# Patient Record
Sex: Male | Born: 1990 | Race: White | Hispanic: No | Marital: Single | Smoking: Current every day smoker
Health system: Southern US, Community
[De-identification: ages and names within clinical notes are randomized; demographics above are authoritative.]

## PROBLEM LIST (undated history)

## (undated) DIAGNOSIS — F419 Anxiety disorder, unspecified: Secondary | ICD-10-CM

## (undated) DIAGNOSIS — B1920 Unspecified viral hepatitis C without hepatic coma: Secondary | ICD-10-CM

## (undated) DIAGNOSIS — F32A Depression, unspecified: Secondary | ICD-10-CM

## (undated) DIAGNOSIS — A630 Anogenital (venereal) warts: Secondary | ICD-10-CM

## (undated) DIAGNOSIS — F329 Major depressive disorder, single episode, unspecified: Secondary | ICD-10-CM

## (undated) HISTORY — DX: Depression, unspecified: F32.A

## (undated) HISTORY — DX: Anxiety disorder, unspecified: F41.9

## (undated) HISTORY — DX: Major depressive disorder, single episode, unspecified: F32.9

---
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
## 2009-10-04 ENCOUNTER — Emergency Department (HOSPITAL_COMMUNITY): Admission: EM | Admit: 2009-10-04 | Discharge: 2009-10-04 | Payer: Self-pay | Admitting: Emergency Medicine

## 2011-01-24 LAB — POCT I-STAT, CHEM 8
BUN: 7 mg/dL (ref 6–23)
Calcium, Ion: 1.05 mmol/L — ABNORMAL LOW (ref 1.12–1.32)
Chloride: 110 mEq/L (ref 96–112)
Potassium: 4.1 mEq/L (ref 3.5–5.1)
Sodium: 138 mEq/L (ref 135–145)

## 2011-01-24 LAB — CBC
HCT: 42.6 % (ref 39.0–52.0)
Hemoglobin: 14.6 g/dL (ref 13.0–17.0)
MCHC: 34.3 g/dL (ref 30.0–36.0)
MCV: 90.8 fL (ref 78.0–100.0)
Platelets: 196 10*3/uL (ref 150–400)
RBC: 4.69 MIL/uL (ref 4.22–5.81)
RDW: 12.6 % (ref 11.5–15.5)
WBC: 5.8 10*3/uL (ref 4.0–10.5)

## 2011-01-24 LAB — RAPID URINE DRUG SCREEN, HOSP PERFORMED
Amphetamines: NOT DETECTED
Barbiturates: NOT DETECTED
Benzodiazepines: POSITIVE — AB
Cocaine: NOT DETECTED
Opiates: NOT DETECTED
Tetrahydrocannabinol: POSITIVE — AB

## 2011-01-24 LAB — DIFFERENTIAL
Basophils Absolute: 0 10*3/uL (ref 0.0–0.1)
Lymphocytes Relative: 23 % (ref 12–46)
Monocytes Absolute: 0.4 10*3/uL (ref 0.1–1.0)
Neutro Abs: 3.9 10*3/uL (ref 1.7–7.7)

## 2011-01-24 LAB — ETHANOL: Alcohol, Ethyl (B): <5 mg/dL (ref 0–10)

## 2012-07-11 ENCOUNTER — Ambulatory Visit (INDEPENDENT_AMBULATORY_CARE_PROVIDER_SITE_OTHER): Payer: Managed Care, Other (non HMO) | Admitting: Family Medicine

## 2012-07-11 VITALS — BP 118/68 | HR 106 | Temp 98.5°F | Resp 16 | Ht 71.0 in | Wt 168.0 lb

## 2012-07-11 DIAGNOSIS — R41840 Attention and concentration deficit: Secondary | ICD-10-CM

## 2012-07-11 NOTE — Progress Notes (Signed)
   [address]   [place], [place]  [address]   [id number]  Subjective:    Patient ID: Roy Ashley, male    DOB: 1991/10/12, 21 y.o.   MRN: [id number]  HPIThis 21 y.o. male presents for evaluation of attention deficit.  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  Having a very difficult time focusing at work.  Working long hours.  Previously prescribed Concerta in 6th grade in [facility]; unsure of name; pediatrician; at age 1.  Maintained on Concerta for one year only; not sure why stopped medication.  Duration of current job four months.  Scared will lose job; does not want to get up in mornings.  Chronic issue with lack of focus.  No sadness, depression, anxiety, nervous.  Sleeping well; usually sleeps 11:00-1:00; wakes up at 7:30am.  Graduated from high school; made 2.3 GPA at graduation (C,D).  No further schooling; wants to go to school.  Likes job; hard work.  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  Has taken some of coworker's Adderall which has helped with focus.    PMH:  None   Psurg: none All: none Medications: none Social: single; lives with parents; no children; +tobacco; no drugs; 6 beers per week; +DUI age 59.  +dating Family: M:  Thyroid problems   Sister: epilepsy, thyroid problems.  Father: healthy.   Review of Systems  Constitutional: Negative for fever, chills, activity change, appetite change and fatigue.  Psychiatric/Behavioral: Positive for decreased concentration. Negative for disturbed wake/sleep cycle and dysphoric mood. The patient is not nervous/anxious.        Objective:   Physical Exam  Nursing note and vitals reviewed. Constitutional: He is oriented to person, place, and time. He appears well-developed and well-nourished. No distress.  HENT:  Head: Normocephalic and atraumatic.  Eyes: Conjunctivae normal are normal. Pupils are equal, round, and reactive to light.  Neck: Normal range of motion. Neck supple. No thyromegaly present.  Cardiovascular:  Normal rate, regular rhythm, normal heart sounds and intact distal pulses.   No murmur heard. Pulmonary/Chest: Effort normal and breath sounds normal.  Lymphadenopathy:    He has no cervical adenopathy.  Neurological: He is alert and oriented to person, place, and time. No cranial nerve deficit. He exhibits normal muscle tone. Coordination normal.  Skin: Skin is warm and dry. He is not diaphoretic.  Psychiatric: He has a normal mood and affect. His behavior is normal. Judgment and thought content normal.       Assessment & Plan:   1. Decreased attention Span  Ambulatory referral to Psychology     1.  Decrease in attention:  New.  Reporting previous diagnosis of ADD but no treatment for past twelve years; no documentation of previous evaluation and diagnosis.  Refer to psychology for full evaluation.  Rx not provided at visit; can return to office after full evaluation by psychology; clinic will need documentation of evaluation and diagnosis.  Pt expressed understanding.  Pt declined labs including TSH at visit.

## 2012-07-11 NOTE — Patient Instructions (Addendum)
We will refer you to psychology to evaluate you for attention deficit disorder.  You should be contacted in upcoming week with appointment.

## 2012-07-15 NOTE — Progress Notes (Signed)
Reviewed and agree.

## 2012-09-01 ENCOUNTER — Ambulatory Visit (INDEPENDENT_AMBULATORY_CARE_PROVIDER_SITE_OTHER): Payer: Managed Care, Other (non HMO) | Admitting: Family Medicine

## 2012-09-01 VITALS — BP 112/72 | HR 109 | Temp 98.1°F | Resp 18 | Ht 71.5 in | Wt 158.8 lb

## 2012-09-01 DIAGNOSIS — J029 Acute pharyngitis, unspecified: Secondary | ICD-10-CM

## 2012-09-01 DIAGNOSIS — R509 Fever, unspecified: Secondary | ICD-10-CM

## 2012-09-01 LAB — POCT RAPID STREP A (OFFICE): Rapid Strep A Screen: NEGATIVE

## 2012-09-01 MED ORDER — HYDROCODONE-HOMATROPINE 5-1.5 MG/5ML PO SYRP: 5.0000 mL | ORAL_SOLUTION | Freq: Three times a day (TID) | ORAL | Status: DC | PRN

## 2012-09-01 MED ORDER — AZITHROMYCIN 500 MG PO TABS: ORAL_TABLET | ORAL | Status: DC

## 2012-09-01 NOTE — Progress Notes (Signed)
  [facility]:  Office Visit  Chief Complaint:  Chief Complaint  Patient presents with  . Sore Throat    x6days  . Otalgia  . Generalized Body Aches  . Cough    HPI: Roy Ashley is a 21 y.o. male who complains of  6 day h/o throat pain, ear pain, generalized aches, cough, took Ibuprofen to help with fever, chills, poor PO due to odynophagia. + tobacco use 1 ppd x 8 years.  Past Medical History  Diagnosis Date  . Depression   . Anxiety    History reviewed. No pertinent past surgical history. History   Social History  . Marital Status: Single    Spouse Name: N/A    Number of Children: N/A  . Years of Education: N/A   Social History Main Topics  . Smoking status: Current Some Day Smoker -- 1.0 packs/day  . Smokeless tobacco: None  . Alcohol Use: No  . Drug Use: Yes    Special: Marijuana  . Sexually Active: None   Other Topics Concern  . None   Social History Narrative  . None   Family History  Problem Relation Age of Onset  . Thyroid disease Mother   . Epilepsy Sister    No Known Allergies Prior to Admission medications   Not on File     ROS: The patient denies night sweats, unintentional weight loss, chest pain, palpitations, wheezing, dyspnea on exertion, nausea, vomiting, abdominal pain, dysuria, hematuria, melena, numbness, weakness, or tingling.   All other systems have been reviewed and were otherwise negative with the exception of those mentioned in the HPI and as above.    PHYSICAL EXAM: Filed Vitals:   09/01/12 0856  BP: 112/72  Pulse: 109  Temp: 98.1 F (36.7 C)  Resp: 18   Filed Vitals:   09/01/12 0856  Height: 5' 11.5" (1.816 m)  Weight: 158 lb 12.8 oz (72.031 kg)   Body mass index is 21.84 kg/(m^2).  General: Alert, no acute distress HEENT:  Normocephalic, atraumatic, oropharynx patent. + red tonsils, + 3 on right side, + exudates. TM nl. No sinus tenderness Cardiovascular:  Regular rate and rhythm, no rubs  murmurs or gallops.  No Carotid bruits, radial pulse intact. No pedal edema.  Respiratory: Clear to auscultation bilaterally.  No wheezes, rales, or rhonchi.  No cyanosis, no use of accessory musculature GI: No organomegaly, abdomen is soft and non-tender, positive bowel sounds.  No masses. Skin: No rashes. Neurologic: Facial musculature symmetric. Psychiatric: Patient is appropriate throughout our interaction. Lymphatic: + bilateral anterior cervical lymphadenopathy, + enlarge adenoids Musculoskeletal: Gait intact.   LABS: Results for orders placed in visit on 09/01/12  POCT RAPID STREP A (OFFICE)      Component Value Range   Rapid Strep A Screen Negative  Negative     EKG/XRAY:   Primary read interpreted by Dr. Conley Rolls at [facility].   ASSESSMENT/PLAN: Encounter Diagnoses  Name Primary?  . Pharyngitis Yes  . Fever    Most likely strep so will treat even though rapid strep negative, culture pending I do not think this is mono but with prior h/o mono will not give pcn derivative abx Will rx Azithromycin 500 mg daily x 7 days Will rx Hydromet syrup 120 ml no refills    LE, THAO PHUONG, DO 09/01/2012 9:40 AM

## 2012-09-03 ENCOUNTER — Encounter: Payer: Self-pay | Admitting: Family Medicine

## 2012-09-03 LAB — CULTURE, GROUP A STREP: Organism ID, Bacteria: NORMAL

## 2015-08-17 ENCOUNTER — Ambulatory Visit (INDEPENDENT_AMBULATORY_CARE_PROVIDER_SITE_OTHER): Payer: Self-pay

## 2015-08-17 ENCOUNTER — Ambulatory Visit (INDEPENDENT_AMBULATORY_CARE_PROVIDER_SITE_OTHER): Payer: Self-pay | Admitting: Internal Medicine

## 2015-08-17 VITALS — BP 146/62 | HR 79 | Temp 98.3°F | Resp 16 | Ht 71.5 in | Wt 148.0 lb

## 2015-08-17 DIAGNOSIS — S93401A Sprain of unspecified ligament of right ankle, initial encounter: Secondary | ICD-10-CM

## 2015-08-17 DIAGNOSIS — M25571 Pain in right ankle and joints of right foot: Secondary | ICD-10-CM

## 2015-08-17 MED ORDER — IBUPROFEN 600 MG PO TABS: 600.0000 mg | ORAL_TABLET | Freq: Three times a day (TID) | ORAL | Status: DC | PRN

## 2015-08-17 NOTE — Progress Notes (Signed)
Patient ID: Roy Ashley Finklea, male   DOB: Mar 16, 1991, 24 y.o.   MRN: [id number]   08/17/2015 at 10:43 AM  Roy Ashley Bilal / DOB: Mar 16, 1991 / MRN: [id number]  Problem list reviewed and updated by me where necessary.   SUBJECTIVE  Roy Ashley Peedin is Ashley 24 y.o. ill appearing male presenting for the chief complaint of injured right ankle, pain ankle, unable to walk.Marland Kitchen.He fell down stairs last night, no hx for abuse or assault, no other injuries.   Usually healthy.  He  has Ashley past medical history of Depression and Anxiety.    Medications reviewed and updated by myself where necessary, and exist elsewhere in the encounter.   Mr. Marina Goodellerry has No Known Allergies. He  reports that he has been smoking.  He does not have any smokeless tobacco history on file. He reports that he uses illicit drugs (Marijuana). He reports that he does not drink alcohol. He  has no sexual activity history on file. The patient  has no past surgical history on file.  His family history includes Epilepsy in his sister; Thyroid disease in his mother.  Review of Systems  Constitutional: Negative.  Negative for fever.  HENT: Negative.   Eyes: Negative.   Respiratory: Negative.  Negative for shortness of breath.   Cardiovascular: Negative.  Negative for chest pain.  Gastrointestinal: Negative.  Negative for nausea.  Musculoskeletal: Positive for myalgias, joint pain and falls.  Skin: Negative for rash.  Neurological: Negative.  Negative for dizziness and headaches.  Psychiatric/Behavioral: Negative.     OBJECTIVE  His  height is 5' 11.5" (1.816 m) and weight is 148 lb (67.132 kg). His oral temperature is 98.3 F (36.8 C). His blood pressure is 146/62 and his pulse is 79. His respiration is 16 and oxygen saturation is 98%.  The patient's body mass index is 20.36 kg/(m^2).  Physical Exam  Constitutional: He is oriented to person, place, and time. He appears well-developed and well-nourished. No distress.  HENT:  Head:  Normocephalic.  Nose: Nose normal.  Eyes: Conjunctivae and EOM are normal.  Respiratory: Effort normal.  GI: There is no tenderness.  Musculoskeletal:       Right ankle: He exhibits decreased range of motion, swelling, ecchymosis and laceration. He exhibits normal pulse. Tenderness. Lateral malleolus and proximal fibula tenderness found. No medial malleolus and no head of 5th metatarsal tenderness found. Achilles tendon normal. Achilles tendon exhibits normal Thompson's test results.       Feet:  Abrasion over lat malleolus  Neurological: He is alert and oriented to person, place, and time. He has normal strength. No sensory deficit. He exhibits normal muscle tone. Coordination and gait abnormal.  Psychiatric: He has Ashley normal mood and affect. His behavior is normal.  [facility] reading (PRIMARY) by  Dr.Sang Blount no fx seen, sts only    No results found for this or any previous visit (from the past 24 hour(s)).  ASSESSMENT & PLAN  Casimiro NeedleMichael was seen today for ankle pain.  Diagnoses and all orders for this visit:  Sprain of ankle, right, initial encounter -     DG Ankle Complete Right; Future  Pain in joint, ankle and foot, right -     DG Ankle Complete Right; Future   Camwalker for 1-2 weeks/then swedo fitted if ready RICE/crutches

## 2015-08-17 NOTE — Patient Instructions (Addendum)
Acute Ankle Sprain With Phase I Rehab An acute ankle sprain is a partial or complete tear in one or more of the ligaments of the ankle due to traumatic injury. The severity of the injury depends on both the number of ligaments sprained and the grade of sprain. There are 3 grades of sprains.   A grade 1 sprain is a mild sprain. There is a slight pull without obvious tearing. There is no loss of strength, and the muscle and ligament are the correct length.  A grade 2 sprain is a moderate sprain. There is tearing of fibers within the substance of the ligament where it connects two bones or two cartilages. The length of the ligament is increased, and there is usually decreased strength.  A grade 3 sprain is a complete rupture of the ligament and is uncommon. In addition to the grade of sprain, there are three types of ankle sprains.  Lateral ankle sprains: This is a sprain of one or more of the three ligaments on the outer side (lateral) of the ankle. These are the most common sprains. Medial ankle sprains: There is one large triangular ligament of the inner side (medial) of the ankle that is susceptible to injury. Medial ankle sprains are less common. Syndesmosis, "high ankle," sprains: The syndesmosis is the ligament that connects the two bones of the lower leg. Syndesmosis sprains usually only occur with very severe ankle sprains. SYMPTOMS  Pain, tenderness, and swelling in the ankle, starting at the side of injury that may progress to the whole ankle and foot with time.  "Pop" or tearing sensation at the time of injury.  Bruising that may spread to the heel.  Impaired ability to walk soon after injury. CAUSES   Acute ankle sprains are caused by trauma placed on the ankle that temporarily forces or pries the anklebone (talus) out of its normal socket.  Stretching or tearing of the ligaments that normally hold the joint in place (usually due to a twisting injury). RISK INCREASES  WITH:  Previous ankle sprain.  Sports in which the foot may land awkwardly (i.e., basketball, volleyball, or soccer) or walking or running on uneven or rough surfaces.  Shoes with inadequate support to prevent sideways motion when stress occurs.  Poor strength and flexibility.  Poor balance skills.  Contact sports. PREVENTION   Warm up and stretch properly before activity.  Maintain physical fitness:  Ankle and leg flexibility, muscle strength, and endurance.  Cardiovascular fitness.  Balance training activities.  Use proper technique and have a coach correct improper technique.  Taping, protective strapping, bracing, or high-top tennis shoes may help prevent injury. Initially, tape is best; however, it loses most of its support function within 10 to 15 minutes.  Wear proper-fitted protective shoes (High-top shoes with taping or bracing is more effective than either alone).  Provide the ankle with support during sports and practice activities for 12 months following injury. PROGNOSIS   If treated properly, ankle sprains can be expected to recover completely; however, the length of recovery depends on the degree of injury.  A grade 1 sprain usually heals enough in 5 to 7 days to allow modified activity and requires an average of 6 weeks to heal completely.  A grade 2 sprain requires 6 to 10 weeks to heal completely.  A grade 3 sprain requires 12 to 16 weeks to heal.  A syndesmosis sprain often takes more than 3 months to heal. RELATED COMPLICATIONS   Frequent recurrence of symptoms may  result in a chronic problem. Appropriately addressing the problem the first time decreases the frequency of recurrence and optimizes healing time. Severity of the initial sprain does not predict the likelihood of later instability.  Injury to other structures (bone, cartilage, or tendon).  A chronically unstable or arthritic ankle joint is a possibility with repeated  sprains. TREATMENT Treatment initially involves the use of ice, medication, and compression bandages to help reduce pain and inflammation. Ankle sprains are usually immobilized in a walking cast or boot to allow for healing. Crutches may be recommended to reduce pressure on the injury. After immobilization, strengthening and stretching exercises may be necessary to regain strength and a full range of motion. Surgery is rarely needed to treat ankle sprains. MEDICATION   Nonsteroidal anti-inflammatory medications, such as aspirin and ibuprofen (do not take for the first 3 days after injury or within 7 days before surgery), or other minor pain relievers, such as acetaminophen, are often recommended. Take these as directed by your caregiver. Contact your caregiver immediately if any bleeding, stomach upset, or signs of an allergic reaction occur from these medications.  Ointments applied to the skin may be helpful.  Pain relievers may be prescribed as necessary by your caregiver. Do not take prescription pain medication for longer than 4 to 7 days. Use only as directed and only as much as you need. HEAT AND COLD  Cold treatment (icing) is used to relieve pain and reduce inflammation for acute and chronic cases. Cold should be applied for 10 to 15 minutes every 2 to 3 hours for inflammation and pain and immediately after any activity that aggravates your symptoms. Use ice packs or an ice massage.  Heat treatment may be used before performing stretching and strengthening activities prescribed by your caregiver. Use a heat pack or a warm soak. SEEK IMMEDIATE MEDICAL CARE IF:   Pain, swelling, or bruising worsens despite treatment.  You experience pain, numbness, discoloration, or coldness in the foot or toes.  New, unexplained symptoms develop (drugs used in treatment may produce side effects.) EXERCISES  PHASE I EXERCISES RANGE OF MOTION (ROM) AND STRETCHING EXERCISES - Ankle Sprain, Acute Phase I,  Weeks 1 to 2 These exercises may help you when beginning to restore flexibility in your ankle. You will likely work on these exercises for the 1 to 2 weeks after your injury. Once your physician, physical therapist, or athletic trainer sees adequate progress, he or she will advance your exercises. While completing these exercises, remember:   Restoring tissue flexibility helps normal motion to return to the joints. This allows healthier, less painful movement and activity.  An effective stretch should be held for at least 30 seconds.  A stretch should never be painful. You should only feel a gentle lengthening or release in the stretched tissue. RANGE OF MOTION - Dorsi/Plantar Flexion  While sitting with your right / left knee straight, draw the top of your foot upwards by flexing your ankle. Then reverse the motion, pointing your toes downward.  Hold each position for __________ seconds.  After completing your first set of exercises, repeat this exercise with your knee bent. Repeat __________ times. Complete this exercise __________ times per day.  RANGE OF MOTION - Ankle Alphabet  Imagine your right / left big toe is a pen.  Keeping your hip and knee still, write out the entire alphabet with your "pen." Make the letters as large as you can without increasing any discomfort. Repeat __________ times. Complete this exercise __________   times per day.  STRENGTHENING EXERCISES - Ankle Sprain, Acute -Phase I, Weeks 1 to 2 These exercises may help you when beginning to restore strength in your ankle. You will likely work on these exercises for 1 to 2 weeks after your injury. Once your physician, physical therapist, or athletic trainer sees adequate progress, he or she will advance your exercises. While completing these exercises, remember:   Muscles can gain both the endurance and the strength needed for everyday activities through controlled exercises.  Complete these exercises as instructed by  your physician, physical therapist, or athletic trainer. Progress the resistance and repetitions only as guided.  You may experience muscle soreness or fatigue, but the pain or discomfort you are trying to eliminate should never worsen during these exercises. If this pain does worsen, stop and make certain you are following the directions exactly. If the pain is still present after adjustments, discontinue the exercise until you can discuss the trouble with your clinician. STRENGTH - Dorsiflexors  Secure a rubber exercise band/tubing to a fixed object (i.e., table, pole) and loop the other end around your right / left foot.  Sit on the floor facing the fixed object. The band/tubing should be slightly tense when your foot is relaxed.  Slowly draw your foot back toward you using your ankle and toes.  Hold this position for __________ seconds. Slowly release the tension in the band and return your foot to the starting position. Repeat __________ times. Complete this exercise __________ times per day.  STRENGTH - Plantar-flexors   Sit with your right / left leg extended. Holding onto both ends of a rubber exercise band/tubing, loop it around the ball of your foot. Keep a slight tension in the band.  Slowly push your toes away from you, pointing them downward.  Hold this position for __________ seconds. Return slowly, controlling the tension in the band/tubing. Repeat __________ times. Complete this exercise __________ times per day.  STRENGTH - Ankle Eversion  Secure one end of a rubber exercise band/tubing to a fixed object (table, pole). Loop the other end around your foot just before your toes.  Place your fists between your knees. This will focus your strengthening at your ankle.  Drawing the band/tubing across your opposite foot, slowly, pull your little toe out and up. Make sure the band/tubing is positioned to resist the entire motion.  Hold this position for __________ seconds. Have  your muscles resist the band/tubing as it slowly pulls your foot back to the starting position.  Repeat __________ times. Complete this exercise __________ times per day.  STRENGTH - Ankle Inversion  Secure one end of a rubber exercise band/tubing to a fixed object (table, pole). Loop the other end around your foot just before your toes.  Place your fists between your knees. This will focus your strengthening at your ankle.  Slowly, pull your big toe up and in, making sure the band/tubing is positioned to resist the entire motion.  Hold this position for __________ seconds.  Have your muscles resist the band/tubing as it slowly pulls your foot back to the starting position. Repeat __________ times. Complete this exercises __________ times per day.  STRENGTH - Towel Curls  Sit in a chair positioned on a non-carpeted surface.  Place your right / left foot on a towel, keeping your heel on the floor.  Pull the towel toward your heel by only curling your toes. Keep your heel on the floor.  If instructed by your physician, physical therapist,   or athletic trainer, add weight to the end of the towel. Repeat __________ times. Complete this exercise __________ times per day.   This information is not intended to replace advice given to you by your health care provider. Make sure you discuss any questions you have with your health care provider.   Document Released: 05/10/2005 Document Revised: 10/30/2014 Document Reviewed: 01/21/2009 Elsevier Interactive Patient Education 2016 Elsevier Inc[employer] RICE for Routine Care of Injuries Theroutine careofmanyinjuriesincludes rest, ice, compression, and elevation (RICE therapy). RICE therapy is often recommended for injuries to soft tissues, such as a muscle strain, ligament injuries, bruises, and overuse injuries. It can also be used for some bony injuries. Using RICE therapy can help to relieve pain, lessen swelling, and enable your body to  heal. Rest Rest is required to allow your body to heal. This usually involves reducing your normal activities and avoiding use of the injured part of your body. Generally, you can return to your normal activities when you are comfortable and have been given permission by your health care provider. Ice Icing your injury helps to keep the swelling down, and it lessens pain. Do not apply ice directly to your skin.  Put ice in a plastic bag.  Place a towel between your skin and the bag.  Leave the ice on for 20 minutes, 2-3 times a day. Do this for as long as you are directed by your health care provider. Compression Compression means putting pressure on the injured area. Compression helps to keep swelling down, gives support, and helps with discomfort. Compression may be done with an elastic bandage. If an elastic bandage has been applied, follow these general tips:  Remove and reapply the bandage every 3-4 hours or as directed by your health care provider.  Make sure the bandage is not wrapped too tightly, because this can cut off circulation. If part of your body beyond the bandage becomes blue, numb, cold, swollen, or more painful, your bandage is most likely too tight. If this occurs, remove your bandage and reapply it more loosely.  See your health care provider if the bandage seems to be making your problems worse rather than better. Elevation Elevation means keeping the injured area raised. This helps to lessen swelling and decrease pain. If possible, your injured area should be elevated at or above the level of your heart or the center of your chest. WHEN SHOULD I SEEK MEDICAL CARE? You should seek medical care if:  Your pain and swelling continue.  Your symptoms are getting worse rather than improving. These symptoms may indicate that further evaluation or further X-rays are needed. Sometimes, X-rays may not show a small broken bone (fracture) until a number of days later. Make a  follow-up appointment with your health care provider. WHEN SHOULD I SEEK IMMEDIATE MEDICAL CARE? You should seek immediate medical care if:  You have sudden severe pain at or below the area of your injury.  You have redness or increased swelling around your injury.  You have tingling or numbness at or below the area of your injury that does not improve after you remove the elastic bandage.   This information is not intended to replace advice given to you by your health care provider. Make sure you discuss any questions you have with your health care provider.   Document Released: 01/21/2001 Document Revised: 06/30/2015 Document Reviewed: 09/16/2014 Elsevier Interactive Patient Education [employer]2016 [employer]

## 2016-07-30 ENCOUNTER — Emergency Department (HOSPITAL_COMMUNITY)
Admission: EM | Admit: 2016-07-30 | Discharge: 2016-07-30 | Disposition: A | Discharge status: Home / Self Care | Payer: Self-pay | Attending: Emergency Medicine | Admitting: Emergency Medicine

## 2016-07-30 ENCOUNTER — Emergency Department (HOSPITAL_COMMUNITY): Payer: Self-pay

## 2016-07-30 ENCOUNTER — Encounter (HOSPITAL_COMMUNITY): Payer: Self-pay | Admitting: Emergency Medicine

## 2016-07-30 DIAGNOSIS — Y929 Unspecified place or not applicable: Secondary | ICD-10-CM | POA: Insufficient documentation

## 2016-07-30 DIAGNOSIS — Y939 Activity, unspecified: Secondary | ICD-10-CM | POA: Insufficient documentation

## 2016-07-30 DIAGNOSIS — M25561 Pain in right knee: Secondary | ICD-10-CM | POA: Insufficient documentation

## 2016-07-30 DIAGNOSIS — M25571 Pain in right ankle and joints of right foot: Secondary | ICD-10-CM | POA: Insufficient documentation

## 2016-07-30 DIAGNOSIS — F17200 Nicotine dependence, unspecified, uncomplicated: Secondary | ICD-10-CM | POA: Insufficient documentation

## 2016-07-30 DIAGNOSIS — Y999 Unspecified external cause status: Secondary | ICD-10-CM | POA: Insufficient documentation

## 2016-07-30 DIAGNOSIS — F1290 Cannabis use, unspecified, uncomplicated: Secondary | ICD-10-CM | POA: Insufficient documentation

## 2016-07-30 MED ORDER — IBUPROFEN 800 MG PO TABS: 800.0000 mg | ORAL_TABLET | Freq: Three times a day (TID) | ORAL | 0 refills | Status: AC

## 2016-07-30 MED ORDER — OXYCODONE-ACETAMINOPHEN 5-325 MG PO TABS
1.0000 | ORAL_TABLET | ORAL | Status: DC | PRN
  Administered 2016-07-30: 1 via ORAL
  Filled 2016-07-30: qty 1
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
## 2016-07-30 NOTE — Discharge Instructions (Signed)
1. Continue home medications. 2. Start taking Ibuprofen three times daily.  You may also take 1000 mg Tylenol every 6 hours.  Do not exceed 4 grams in 24 hours.  Apply ice three times daily. 3.  Follow up with orthopedics if symptoms persist.
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
## 2018-10-18 ENCOUNTER — Encounter (HOSPITAL_BASED_OUTPATIENT_CLINIC_OR_DEPARTMENT_OTHER): Payer: Self-pay | Admitting: Emergency Medicine

## 2018-10-18 ENCOUNTER — Other Ambulatory Visit: Payer: Self-pay

## 2018-10-18 ENCOUNTER — Emergency Department (HOSPITAL_BASED_OUTPATIENT_CLINIC_OR_DEPARTMENT_OTHER)
Admission: EM | Admit: 2018-10-18 | Discharge: 2018-10-18 | Disposition: A | Discharge status: Home / Self Care | Payer: Self-pay | Attending: Emergency Medicine | Admitting: Emergency Medicine

## 2018-10-18 DIAGNOSIS — Z87891 Personal history of nicotine dependence: Secondary | ICD-10-CM | POA: Insufficient documentation

## 2018-10-18 DIAGNOSIS — A630 Anogenital (venereal) warts: Secondary | ICD-10-CM | POA: Insufficient documentation

## 2018-10-18 DIAGNOSIS — Z79899 Other long term (current) drug therapy: Secondary | ICD-10-CM | POA: Insufficient documentation

## 2018-10-18 HISTORY — DX: Unspecified viral hepatitis C without hepatic coma: B19.20

## 2018-10-18 HISTORY — DX: Anogenital (venereal) warts: A63.0

## 2018-10-18 MED ORDER — IMIQUIMOD 3.75 % EX CREA: TOPICAL_CREAM | CUTANEOUS | 0 refills | Status: DC
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
## 2018-10-18 NOTE — Discharge Instructions (Signed)
You were seen here today for concerns for genital warts.  I have provided you a prescription for Imiquimod. Use as directed.  Please follow-up with the health department for further testing and treatment.  You were offered testing of HIV, Syphilis, Gonorrhea and Chlamydia here in the department. Please see attached handout. If you develop worsening or new concerning symptoms you can return to the emergency department for re-evaluation.
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
## 2018-10-18 NOTE — ED Triage Notes (Addendum)
Pt sts he has had genital warts since age 27, and he has gotten cream for it before that "made them fall off".  Sts they are bigger now and he has seen TV ads about them causing cancer and he wants to see for sure what this is and "make sure I'm OK."  [facility]

## 2018-10-18 NOTE — ED Notes (Signed)
ED Provider at bedside. 

## 2018-10-18 NOTE — ED Notes (Signed)
Urine spec obtained and sent to lab.

## 2018-10-19 ENCOUNTER — Telehealth (HOSPITAL_BASED_OUTPATIENT_CLINIC_OR_DEPARTMENT_OTHER): Payer: Self-pay | Admitting: Emergency Medicine

## 2019-08-06 ENCOUNTER — Inpatient Hospital Stay (HOSPITAL_COMMUNITY)
Admission: EM | Admit: 2019-08-06 | Discharge: 2019-08-07 | DRG: 563 | Discharge status: Against Medical Advice | Payer: Self-pay | Attending: Orthopedic Surgery | Admitting: Orthopedic Surgery

## 2019-08-06 ENCOUNTER — Encounter (HOSPITAL_COMMUNITY): Payer: Self-pay

## 2019-08-06 ENCOUNTER — Emergency Department (HOSPITAL_COMMUNITY): Payer: Self-pay

## 2019-08-06 ENCOUNTER — Other Ambulatory Visit: Payer: Self-pay

## 2019-08-06 DIAGNOSIS — Z20828 Contact with and (suspected) exposure to other viral communicable diseases: Secondary | ICD-10-CM | POA: Diagnosis present

## 2019-08-06 DIAGNOSIS — T1490XA Injury, unspecified, initial encounter: Secondary | ICD-10-CM

## 2019-08-06 DIAGNOSIS — S42002A Fracture of unspecified part of left clavicle, initial encounter for closed fracture: Secondary | ICD-10-CM | POA: Diagnosis present

## 2019-08-06 DIAGNOSIS — S82291A Other fracture of shaft of right tibia, initial encounter for closed fracture: Principal | ICD-10-CM | POA: Diagnosis present

## 2019-08-06 LAB — I-STAT CHEM 8, ED
BUN: 12 mg/dL (ref 6–20)
Calcium, Ion: 1.22 mmol/L (ref 1.15–1.40)
Chloride: 106 mmol/L (ref 98–111)
Creatinine, Ser: 0.9 mg/dL (ref 0.61–1.24)
Glucose, Bld: 95 mg/dL (ref 70–99)
HCT: 35 % — ABNORMAL LOW (ref 39.0–52.0)
Hemoglobin: 11.9 g/dL — ABNORMAL LOW (ref 13.0–17.0)
Potassium: 4.5 mmol/L (ref 3.5–5.1)
Sodium: 139 mmol/L (ref 135–145)
TCO2: 25 mmol/L (ref 22–32)

## 2019-08-06 LAB — COMPREHENSIVE METABOLIC PANEL
ALT: 58 U/L — ABNORMAL HIGH (ref 0–44)
AST: 38 U/L (ref 15–41)
Albumin: 3.7 g/dL (ref 3.5–5.0)
Alkaline Phosphatase: 72 U/L (ref 38–126)
Anion gap: 11 (ref 5–15)
BUN: 13 mg/dL (ref 6–20)
CO2: 22 mmol/L (ref 22–32)
Calcium: 9.2 mg/dL (ref 8.9–10.3)
Chloride: 104 mmol/L (ref 98–111)
Creatinine, Ser: 0.98 mg/dL (ref 0.61–1.24)
GFR calc Af Amer: >60 mL/min (ref >60)
GFR calc non Af Amer: >60 mL/min (ref >60)
Glucose, Bld: 97 mg/dL (ref 70–99)
Potassium: 4.5 mmol/L (ref 3.5–5.1)
Sodium: 137 mmol/L (ref 135–145)
Total Bilirubin: 0.5 mg/dL (ref 0.3–1.2)
Total Protein: 7.1 g/dL (ref 6.5–8.1)

## 2019-08-06 LAB — CBC
HCT: 36.5 % — ABNORMAL LOW (ref 39.0–52.0)
Hemoglobin: 11.6 g/dL — ABNORMAL LOW (ref 13.0–17.0)
MCH: 27.6 pg (ref 26.0–34.0)
MCHC: 31.8 g/dL (ref 30.0–36.0)
MCV: 86.7 fL (ref 80.0–100.0)
Platelets: 198 10*3/uL (ref 150–400)
RBC: 4.21 MIL/uL — ABNORMAL LOW (ref 4.22–5.81)
RDW: 15.4 % (ref 11.5–15.5)
WBC: 7.1 10*3/uL (ref 4.0–10.5)
nRBC: 0 % (ref 0.0–0.2)

## 2019-08-06 LAB — SAMPLE TO BLOOD BANK

## 2019-08-06 LAB — LACTIC ACID, PLASMA: Lactic Acid, Venous: 0.7 mmol/L (ref 0.5–1.9)

## 2019-08-06 LAB — PROTIME-INR
INR: 1 (ref 0.8–1.2)
Prothrombin Time: 12.6 seconds (ref 11.4–15.2)

## 2019-08-06 LAB — ETHANOL: Alcohol, Ethyl (B): <10 mg/dL (ref <10)

## 2019-08-06 MED ORDER — IOHEXOL 300 MG/ML  SOLN
100.0000 mL | Freq: Once | INTRAMUSCULAR | Status: AC | PRN
  Administered 2019-08-06: 22:00:00 100 mL via INTRAVENOUS

## 2019-08-06 MED ORDER — FENTANYL CITRATE (PF) 100 MCG/2ML IJ SOLN
50.0000 ug | Freq: Once | INTRAMUSCULAR | Status: AC
  Administered 2019-08-06: 22:00:00 50 ug via INTRAVENOUS
  Filled 2019-08-06: qty 2

## 2019-08-06 MED ORDER — TETANUS-DIPHTH-ACELL PERTUSSIS 5-2.5-18.5 LF-MCG/0.5 IM SUSP: 0.5000 mL | Freq: Once | INTRAMUSCULAR | Status: DC

## 2019-08-06 MED ORDER — ONDANSETRON HCL 4 MG/2ML IJ SOLN
4.0000 mg | Freq: Once | INTRAMUSCULAR | Status: AC
  Administered 2019-08-06: 4 mg via INTRAVENOUS
  Filled 2019-08-06: qty 2
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
## 2019-08-06 NOTE — Progress Notes (Signed)
I have reviewed xrays and discussed case with EDP.  Will likely need ORIF of both left clavicle and certainly right tibia.  I will discuss with Ortho Trauma team as to the timing of the surgeries.  Full consult to come in the am.  NPO tonight at [place].    Sling to left arm and splint to left leg at this time.

## 2019-08-07 LAB — URINALYSIS, ROUTINE W REFLEX MICROSCOPIC
Bilirubin Urine: NEGATIVE
Glucose, UA: NEGATIVE mg/dL
Hgb urine dipstick: NEGATIVE
Ketones, ur: 20 mg/dL — AB
Leukocytes,Ua: NEGATIVE
Nitrite: NEGATIVE
Protein, ur: NEGATIVE mg/dL
Specific Gravity, Urine: 1.044 — ABNORMAL HIGH (ref 1.005–1.030)
pH: 8 (ref 5.0–8.0)

## 2019-08-07 LAB — SARS CORONAVIRUS 2 BY RT PCR (HOSPITAL ORDER, PERFORMED IN ~~LOC~~ HOSPITAL LAB): SARS Coronavirus 2: NEGATIVE

## 2019-08-07 MED ORDER — HYDROCODONE-ACETAMINOPHEN 5-325 MG PO TABS: 1.0000 | ORAL_TABLET | ORAL | Status: DC | PRN

## 2019-08-07 MED ORDER — ACETAMINOPHEN 500 MG PO TABS: 500.0000 mg | ORAL_TABLET | Freq: Four times a day (QID) | ORAL | Status: DC

## 2019-08-07 MED ORDER — HYDROCODONE-ACETAMINOPHEN 7.5-325 MG PO TABS: 1.0000 | ORAL_TABLET | ORAL | Status: DC | PRN

## 2019-08-07 MED ORDER — MORPHINE SULFATE (PF) 2 MG/ML IV SOLN
0.5000 mg | INTRAVENOUS | Status: DC | PRN
  Filled 2019-08-07: qty 1

## 2019-08-07 MED ORDER — ONDANSETRON HCL 4 MG/2ML IJ SOLN: 4.0000 mg | Freq: Four times a day (QID) | INTRAMUSCULAR | Status: DC | PRN

## 2019-08-07 MED ORDER — METHOCARBAMOL 1000 MG/10ML IJ SOLN: 500.0000 mg | Freq: Four times a day (QID) | INTRAVENOUS | Status: DC | PRN

## 2019-08-07 MED ORDER — ACETAMINOPHEN 325 MG PO TABS: 325.0000 mg | ORAL_TABLET | Freq: Four times a day (QID) | ORAL | Status: DC | PRN

## 2019-08-07 MED ORDER — METHOCARBAMOL 500 MG PO TABS: 500.0000 mg | ORAL_TABLET | Freq: Four times a day (QID) | ORAL | Status: DC | PRN

## 2019-08-07 MED ORDER — ONDANSETRON HCL 4 MG PO TABS: 4.0000 mg | ORAL_TABLET | Freq: Four times a day (QID) | ORAL | Status: DC | PRN

## 2019-08-07 NOTE — ED Notes (Signed)
Pt became irrate, demaning pain medicine.  As RN was returning w/ medication visitor came out yelling saying that they were going to [place].  RN notified provider who went bedside and attempted to convince pt that we could control pain but he could not have anything to eat b/c of the upcoming surgery.  Pt stated "I'll go and get my own medication."  Pt demanded to leave, he is leaving AMA.

## 2019-08-07 NOTE — ED Provider Notes (Signed)
Informed by nursing that patient wishing to leave AMA.    Patient complaining that he cannot eat and is wishing to obtain pain medication.  Pain medication has been ordered and the nurse has pulled it from the [facility].  He is refusing to stay and states that he wants to go to [employer].  I discussed with him that this will prolong his hospital course and potential for surgery.  He reports that he wants to get something to eat.  Again I discussed with him that because of surgery, eating would prolong his ability to get his leg fixed.  Patient persistent about leaving and understands the risk and benefits.  I did discuss with him that his coronavirus test was negative which would mean he could have surgery later today.  He states "I do not care."  We discussed leaving [place].  Patient was able to verbalize understanding.  I did discuss the patient with Dr. Stann Mainland and updated him with the patient's wishes to leave [place].        Merryl Hacker, MD 08/07/19 [phone]
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
## 2019-08-07 NOTE — Progress Notes (Signed)
Orthopedic Tech Progress Note Patient Details:  Roy Ashley Jun 20, 1991 [id number]  Ortho Devices Type of Ortho Device: Short leg splint, Sling immobilizer Ortho Device/Splint Interventions: Adjustment, Application, Ordered   Post Interventions Patient Tolerated: Well Instructions Provided: Care of device, Adjustment of device   Cheryl Chay T 08/07/2019, 1:22 AM

## 2019-08-08 DIAGNOSIS — S42022A Displaced fracture of shaft of left clavicle, initial encounter for closed fracture: Secondary | ICD-10-CM | POA: Insufficient documentation

## 2019-08-21 ENCOUNTER — Encounter (HOSPITAL_BASED_OUTPATIENT_CLINIC_OR_DEPARTMENT_OTHER): Payer: Self-pay | Admitting: *Deleted

## 2019-08-21 ENCOUNTER — Other Ambulatory Visit: Payer: Self-pay

## 2019-08-21 ENCOUNTER — Emergency Department (HOSPITAL_BASED_OUTPATIENT_CLINIC_OR_DEPARTMENT_OTHER)
Admission: EM | Admit: 2019-08-21 | Discharge: 2019-08-21 | Disposition: A | Discharge status: Home / Self Care | Payer: Self-pay | Attending: Emergency Medicine | Admitting: Emergency Medicine

## 2019-08-21 ENCOUNTER — Emergency Department (HOSPITAL_BASED_OUTPATIENT_CLINIC_OR_DEPARTMENT_OTHER): Payer: Self-pay

## 2019-08-21 DIAGNOSIS — L02414 Cutaneous abscess of left upper limb: Secondary | ICD-10-CM | POA: Insufficient documentation

## 2019-08-21 DIAGNOSIS — F17210 Nicotine dependence, cigarettes, uncomplicated: Secondary | ICD-10-CM | POA: Insufficient documentation

## 2019-08-21 DIAGNOSIS — Z79899 Other long term (current) drug therapy: Secondary | ICD-10-CM | POA: Insufficient documentation

## 2019-08-21 DIAGNOSIS — L0291 Cutaneous abscess, unspecified: Secondary | ICD-10-CM

## 2019-08-21 MED ORDER — ACETAMINOPHEN 500 MG PO TABS
1000.0000 mg | ORAL_TABLET | Freq: Once | ORAL | Status: AC
  Administered 2019-08-21: 18:00:00 1000 mg via ORAL
  Filled 2019-08-21: qty 2

## 2019-08-21 MED ORDER — LIDOCAINE-EPINEPHRINE (PF) 2 %-1:200000 IJ SOLN
20.0000 mL | Freq: Once | INTRAMUSCULAR | Status: AC
  Administered 2019-08-21: 10 mL
  Filled 2019-08-21: qty 20

## 2019-08-21 MED ORDER — SULFAMETHOXAZOLE-TRIMETHOPRIM 800-160 MG PO TABS
1.0000 | ORAL_TABLET | Freq: Once | ORAL | Status: AC
  Administered 2019-08-21: 1 via ORAL
  Filled 2019-08-21: qty 1

## 2019-08-21 MED ORDER — SULFAMETHOXAZOLE-TRIMETHOPRIM 800-160 MG PO TABS: 1.0000 | ORAL_TABLET | Freq: Two times a day (BID) | ORAL | 0 refills | Status: AC

## 2019-08-21 MED ORDER — IBUPROFEN 400 MG PO TABS
600.0000 mg | ORAL_TABLET | Freq: Once | ORAL | Status: AC
  Administered 2019-08-21: 600 mg via ORAL
  Filled 2019-08-21: qty 1

## 2019-08-21 NOTE — ED Notes (Signed)
ED Provider at bedside. 

## 2019-08-21 NOTE — ED Notes (Signed)
Returned from XR 

## 2019-08-21 NOTE — ED Triage Notes (Signed)
Abscess to his left AC from IV drug abuse. Site is red, swollen and painful.
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
## 2019-08-21 NOTE — Discharge Instructions (Addendum)
You were seen in the ER for swelling and pain in your left arm.  You have an abscess.  This was incised and drained and irrigated.  We discussed your wound is very high risk for worsening infection.  Take antibiotic Bactrim every 12 hours without missing any doses.  Alternate 600 mg of ibuprofen and 1000 mg of acetaminophen every 6 hours.  Place a heating pad on the area.  You need to come back to the ER or urgent care in the next 48 hours for wound recheck and to make sure there is no worsening infection.  Return earlier to the ER if you develop fever other than 100, worsening redness, pain, swelling, chest pain.

## 2019-08-23 ENCOUNTER — Other Ambulatory Visit: Payer: Self-pay

## 2019-08-23 ENCOUNTER — Encounter (HOSPITAL_BASED_OUTPATIENT_CLINIC_OR_DEPARTMENT_OTHER): Payer: Self-pay | Admitting: Emergency Medicine

## 2019-08-23 ENCOUNTER — Emergency Department (HOSPITAL_BASED_OUTPATIENT_CLINIC_OR_DEPARTMENT_OTHER)
Admission: EM | Admit: 2019-08-23 | Discharge: 2019-08-23 | Disposition: A | Discharge status: Home / Self Care | Payer: Self-pay | Attending: Emergency Medicine | Admitting: Emergency Medicine

## 2019-08-23 DIAGNOSIS — Z09 Encounter for follow-up examination after completed treatment for conditions other than malignant neoplasm: Secondary | ICD-10-CM

## 2019-08-23 DIAGNOSIS — F17210 Nicotine dependence, cigarettes, uncomplicated: Secondary | ICD-10-CM | POA: Insufficient documentation

## 2019-08-23 DIAGNOSIS — Z4800 Encounter for change or removal of nonsurgical wound dressing: Secondary | ICD-10-CM | POA: Insufficient documentation

## 2019-08-23 NOTE — Discharge Instructions (Signed)
Continue taking antibiotics as prescribed. Use Tylenol or ibuprofen as needed for pain. Use warm compress for symptom control. Return to the emergency room if you develop fevers, thick white pus draining from the area, streaking red lines, or any new, worsening, or concerning symptoms.
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
## 2019-09-24 ENCOUNTER — Emergency Department (HOSPITAL_COMMUNITY)
Admission: EM | Admit: 2019-09-24 | Discharge: 2019-09-25 | Disposition: A | Discharge status: Home / Self Care | Payer: Self-pay | Attending: Emergency Medicine | Admitting: Emergency Medicine

## 2019-09-24 ENCOUNTER — Other Ambulatory Visit: Payer: Self-pay

## 2019-09-24 ENCOUNTER — Encounter (HOSPITAL_COMMUNITY): Payer: Self-pay

## 2019-09-24 DIAGNOSIS — F419 Anxiety disorder, unspecified: Secondary | ICD-10-CM | POA: Insufficient documentation

## 2019-09-24 DIAGNOSIS — F17210 Nicotine dependence, cigarettes, uncomplicated: Secondary | ICD-10-CM | POA: Insufficient documentation

## 2019-09-24 DIAGNOSIS — R45851 Suicidal ideations: Secondary | ICD-10-CM | POA: Insufficient documentation

## 2019-09-24 DIAGNOSIS — F322 Major depressive disorder, single episode, severe without psychotic features: Secondary | ICD-10-CM | POA: Insufficient documentation

## 2019-09-24 DIAGNOSIS — F1120 Opioid dependence, uncomplicated: Secondary | ICD-10-CM | POA: Insufficient documentation

## 2019-09-24 DIAGNOSIS — Z20828 Contact with and (suspected) exposure to other viral communicable diseases: Secondary | ICD-10-CM | POA: Insufficient documentation

## 2019-09-24 DIAGNOSIS — F1110 Opioid abuse, uncomplicated: Secondary | ICD-10-CM

## 2019-09-24 LAB — COMPREHENSIVE METABOLIC PANEL
ALT: 43 U/L (ref 0–44)
AST: 28 U/L (ref 15–41)
Albumin: 3.8 g/dL (ref 3.5–5.0)
Alkaline Phosphatase: 102 U/L (ref 38–126)
Anion gap: 7 (ref 5–15)
BUN: 13 mg/dL (ref 6–20)
CO2: 28 mmol/L (ref 22–32)
Calcium: 9.3 mg/dL (ref 8.9–10.3)
Chloride: 101 mmol/L (ref 98–111)
Creatinine, Ser: 0.84 mg/dL (ref 0.61–1.24)
GFR calc Af Amer: >60 mL/min (ref >60)
GFR calc non Af Amer: >60 mL/min (ref >60)
Glucose, Bld: 95 mg/dL (ref 70–99)
Potassium: 4.4 mmol/L (ref 3.5–5.1)
Sodium: 136 mmol/L (ref 135–145)
Total Bilirubin: 0.4 mg/dL (ref 0.3–1.2)
Total Protein: 7.5 g/dL (ref 6.5–8.1)

## 2019-09-24 LAB — CBC
HCT: 37.3 % — ABNORMAL LOW (ref 39.0–52.0)
Hemoglobin: 11.2 g/dL — ABNORMAL LOW (ref 13.0–17.0)
MCH: 27.1 pg (ref 26.0–34.0)
MCHC: 30 g/dL (ref 30.0–36.0)
MCV: 90.3 fL (ref 80.0–100.0)
Platelets: 255 10*3/uL (ref 150–400)
RBC: 4.13 MIL/uL — ABNORMAL LOW (ref 4.22–5.81)
RDW: 13.9 % (ref 11.5–15.5)
WBC: 5.1 10*3/uL (ref 4.0–10.5)
nRBC: 0 % (ref 0.0–0.2)

## 2019-09-24 LAB — ACETAMINOPHEN LEVEL: Acetaminophen (Tylenol), Serum: <10 ug/mL — ABNORMAL LOW (ref 10–30)

## 2019-09-24 LAB — ETHANOL: Alcohol, Ethyl (B): <10 mg/dL (ref <10)

## 2019-09-24 LAB — SALICYLATE LEVEL: Salicylate Lvl: <7 mg/dL (ref 2.8–30.0)

## 2019-09-24 NOTE — ED Triage Notes (Signed)
Pt arrived stating he need detox from heroin. Patient states last use at 4pm today. Also stating he has been having suicidal thoughts over the last few weeks due to homelessness, drug use, and relationship trouble. Denies a plan.

## 2019-09-24 NOTE — ED Notes (Signed)
Pt was wanded by security and walked back to room 30.  Pt's two belonging bags were placed in corresponding locker and suitcase was placed behind nurses station with a pt sticker on it. Pt is aware urine sample is needed but is unable to provide at this time.

## 2019-09-24 NOTE — ED Notes (Signed)
Provided pt with burgundy scrubs and pt belonging bags and instructed him to dress out.

## 2019-09-24 NOTE — Progress Notes (Signed)
Received Roy Ashley from triage with an escort. He was oriented to his new environment and provided fluids per his request. He immediately drifted off to sleep later he spoke with TTS.The sitter remains at the bedside.

## 2019-09-25 DIAGNOSIS — F1110 Opioid abuse, uncomplicated: Secondary | ICD-10-CM

## 2019-09-25 LAB — SARS CORONAVIRUS 2 (TAT 6-24 HRS): SARS Coronavirus 2: NEGATIVE

## 2019-09-25 NOTE — [facility] Suicide Risk Assessment (Cosign Needed)
Suicide Risk Assessment  Discharge Assessment   [facility] Discharge Suicide Risk Assessment   Principal Problem: Heroin abuse ([place]) Discharge Diagnoses: Principal Problem:   Heroin abuse ([place])   Total Time spent with patient: 30 minutes  Musculoskeletal: Strength & Muscle Tone: within normal limits Gait & Station: normal Patient leans: N/A  Psychiatric Specialty Exam:   Blood pressure 98/60, pulse 78, temperature 98.1 F (36.7 C), temperature source Oral, resp. rate 16, height 6' (1.829 m), weight 63.5 kg, SpO2 98 %.Body mass index is 18.99 kg/m.  General Appearance: Casual and Fairly Groomed  Eye Contact::  Good  Speech:  Clear and Coherent and Normal Rate409  Volume:  Normal  Mood:  Anxious  Affect:  Appropriate and Congruent  Thought Process:  Coherent, Goal Directed and Descriptions of Associations: Intact  Orientation:  Full (Time, Place, and Person)  Thought Content:  WDL and Logical  Suicidal Thoughts:  No  Homicidal Thoughts:  No  Memory:  Immediate;   Good Recent;   Good Remote;   Good  Judgement:  Good  Insight:  Fair  Psychomotor Activity:  Normal  Concentration:  Good  Recall:  Good  Fund of Knowledge:Good  Language: Good  Akathisia:  No  Handed:  Right  AIMS (if indicated):     Assets:  Communication Skills Desire for Improvement Financial Resources/Insurance Social Support  Sleep:     Cognition: WNL  ADL's:  Intact   Mental Status Per Nursing Assessment::   On Admission:   Patient presents to [facility] long emergency department unaccompanied.  Patient reports "personal issues."  Patient endorses heroin use x10 years.  Patient denies suicidal and homicidal ideations.  Patient denies access to weapons.  Patient denies hallucinations.  Patient reports longest period of sobriety "the 3 years I was in prison."  Patient seen by peers support.  Patient agrees with plan to discharge and follow-up with substance use resources and outpatient mental health  resources. Patient seen along with Dr. Dwyane Dee who agrees with plan to discharge with resources.  Demographic Factors:  Male and Caucasian  Loss Factors: NA  Historical Factors: NA  Risk Reduction Factors:   Sense of responsibility to family, Living with another person, especially a relative, Positive social support, Positive therapeutic relationship and Positive coping skills or problem solving skills  Continued Clinical Symptoms:  Alcohol/Substance Abuse/Dependencies  Cognitive Features That Contribute To Risk:  None    Suicide Risk:  Minimal: No identifiable suicidal ideation.  Patients presenting with no risk factors but with morbid ruminations; may be classified as minimal risk based on the severity of the depressive symptoms    Plan Of Care/Follow-up recommendations:  Other:  Follow up with substance use resources  Emmaline Kluver, FNP 09/25/2019, 11:34 AM

## 2019-09-25 NOTE — ED Notes (Signed)
Pt discharged safely.  He has been accepted at [facility] and has been waiting on a ride.  All belongings were returned to patient.

## 2019-09-25 NOTE — Patient Outreach (Signed)
[facility] received the call at 3 Pm and was made aware that Pt was admitted to [facility]. Pt was informed that he will have a bed read at midnight tonight. Pt stated that he does have his own transportation an will be able to get himself there.

## 2019-09-25 NOTE — BH Assessment (Signed)
[facility] [facility] Assessment Progress Note  Per Letitia Libra, FNP, this pt does not require psychiatric hospitalization at this time.  Pt is to be discharged from [facility] with recommendation to follow up with [facility] [place].  This has been included in pt's discharge instructions.  Pt would also benefit from seeing Peer Support Specialists; they will be asked to speak to pt.  Pt's nurse, Melody, has been notified.  Jalene Mullet, [place] Triage Specialist [phone]
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
## 2019-09-25 NOTE — Patient Outreach (Signed)
ED Peer Support Specialist Patient Intake (Complete at intake & 30-60 Day Follow-up)  Name: Roy Ashley  MRN: [id number]  Age: 28 y.o.   Date of Admission: 09/25/2019  Intake: Initial Comments:      Primary Reason Admitted: SI and substance abuse..  Lab values: Alcohol/ETOH: Negative Positive UDS? Drug screen not completed Amphetamines: Drug screen not completed Barbiturates: Drug screen not completed Benzodiazepines: Drug screen not completed Cocaine: Drug screen not completed Opiates:   Cannabinoids: Drug screen not completed  Demographic information: Gender: Male Ethnicity: White Marital Status: Single Insurance Status: Uninsured/Self-pay Ecologist (Work Neurosurgeon, Physicist, medical, etc.: No Lives with: Alone Living situation: Homeless  Reported Patient History: Patient reported health conditions: Anxiety disorders, Depression Patient aware of HIV and hepatitis status: Yes (comment)(Hep C)  In past year, has patient visited ED for any reason? Yes  Number of ED visits: 1  Reason(s) for visit: Broken Leg  In past year, has patient been hospitalized for any reason? No  Number of hospitalizations:    Reason(s) for hospitalization:    In past year, has patient been arrested? Yes  Number of arrests: 1  Reason(s) for arrest:    In past year, has patient been incarcerated? No  Number of incarcerations:    Reason(s) for incarceration:    In past year, has patient received medication-assisted treatment? No  In past year, patient received the following treatments:    In past year, has patient received any harm reduction services? No  Did this include any of the following?    In past year, has patient received care from a mental health provider for diagnosis other than SUD? No  In past year, is this first time patient has overdosed? Yes  Number of past overdoses:    In past year, is this first time patient has been hospitalized  for an overdose? No  Number of hospitalizations for overdose(s):    Is patient currently receiving treatment for a mental health diagnosis? No  Patient reports experiencing difficulty participating in SUD treatment: No    Most important reason(s) for this difficulty?    Has patient received prior services for treatment? No  In past, patient has received services from following agencies:    Plan of Care:  Suggested follow up at these agencies/treatment centers: Other (comment)  Other information: CPSS met with Pt an was made aware that he does not feel well because of what he stated ( dope sick ). CPSS addressed the fact that needs help asap. CPSS was able to complete the series of question needed to better understand how to service Pt. CPSS mentioned to Pt a few options that Pt may benefit from. CPSS was able to get a consent form signed and faxed Pt information out, and are waiting for reply back.    Aaron Edelman Lataisha Colan, CPSS  09/25/2019 11:46 AM
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
## 2019-09-25 NOTE — Discharge Instructions (Signed)
For your behavioral health needs you are advised to follow up with [facility].  New patients are seen at their walk-in clinic.  Walk-in hours are Monday - Friday from 8:30 am - 12:00 pm, and from 1:00 pm - 2:30 pm.  Walk-in patients are seen on a first come, first served basis, so try to arrive as early as possible for the best chance of being seen the same day:       [facility]      [address]      [place], [place] [address]      [phone] 

## 2020-04-16 ENCOUNTER — Emergency Department (HOSPITAL_COMMUNITY): Payer: Self-pay

## 2020-04-16 ENCOUNTER — Encounter (HOSPITAL_COMMUNITY): Payer: Self-pay

## 2020-04-16 ENCOUNTER — Other Ambulatory Visit: Payer: Self-pay

## 2020-04-16 ENCOUNTER — Emergency Department (HOSPITAL_COMMUNITY)
Admission: EM | Admit: 2020-04-16 | Discharge: 2020-04-16 | Disposition: A | Discharge status: Home / Self Care | Payer: Self-pay | Attending: Emergency Medicine | Admitting: Emergency Medicine

## 2020-04-16 DIAGNOSIS — Y9389 Activity, other specified: Secondary | ICD-10-CM | POA: Insufficient documentation

## 2020-04-16 DIAGNOSIS — Y999 Unspecified external cause status: Secondary | ICD-10-CM | POA: Insufficient documentation

## 2020-04-16 DIAGNOSIS — B1920 Unspecified viral hepatitis C without hepatic coma: Secondary | ICD-10-CM | POA: Insufficient documentation

## 2020-04-16 DIAGNOSIS — Y929 Unspecified place or not applicable: Secondary | ICD-10-CM | POA: Insufficient documentation

## 2020-04-16 DIAGNOSIS — S022XXA Fracture of nasal bones, initial encounter for closed fracture: Secondary | ICD-10-CM | POA: Insufficient documentation

## 2020-04-16 DIAGNOSIS — S4992XA Unspecified injury of left shoulder and upper arm, initial encounter: Secondary | ICD-10-CM | POA: Insufficient documentation

## 2020-04-16 DIAGNOSIS — F17210 Nicotine dependence, cigarettes, uncomplicated: Secondary | ICD-10-CM | POA: Insufficient documentation

## 2020-04-16 DIAGNOSIS — R0789 Other chest pain: Secondary | ICD-10-CM | POA: Insufficient documentation

## 2020-04-16 DIAGNOSIS — S299XXA Unspecified injury of thorax, initial encounter: Secondary | ICD-10-CM | POA: Insufficient documentation

## 2020-04-16 DIAGNOSIS — S0240EA Zygomatic fracture, right side, initial encounter for closed fracture: Secondary | ICD-10-CM | POA: Insufficient documentation

## 2020-04-16 DIAGNOSIS — F1110 Opioid abuse, uncomplicated: Secondary | ICD-10-CM | POA: Insufficient documentation

## 2020-04-16 DIAGNOSIS — S40012A Contusion of left shoulder, initial encounter: Secondary | ICD-10-CM | POA: Insufficient documentation

## 2020-04-16 LAB — COMPREHENSIVE METABOLIC PANEL
ALT: 62 U/L — ABNORMAL HIGH (ref 0–44)
AST: 60 U/L — ABNORMAL HIGH (ref 15–41)
Albumin: 4 g/dL (ref 3.5–5.0)
Alkaline Phosphatase: 109 U/L (ref 38–126)
Anion gap: 11 (ref 5–15)
BUN: 9 mg/dL (ref 6–20)
CO2: 27 mmol/L (ref 22–32)
Calcium: 10.3 mg/dL (ref 8.9–10.3)
Chloride: 101 mmol/L (ref 98–111)
Creatinine, Ser: 0.97 mg/dL (ref 0.61–1.24)
GFR calc Af Amer: >60 mL/min (ref >60)
GFR calc non Af Amer: >60 mL/min (ref >60)
Glucose, Bld: 97 mg/dL (ref 70–99)
Potassium: 3.9 mmol/L (ref 3.5–5.1)
Sodium: 139 mmol/L (ref 135–145)
Total Bilirubin: 0.6 mg/dL (ref 0.3–1.2)
Total Protein: 7.4 g/dL (ref 6.5–8.1)

## 2020-04-16 LAB — CBC WITH DIFFERENTIAL/PLATELET
Abs Immature Granulocytes: 0.04 10*3/uL (ref 0.00–0.07)
Basophils Absolute: 0 10*3/uL (ref 0.0–0.1)
Basophils Relative: 0 %
Eosinophils Absolute: 0 10*3/uL (ref 0.0–0.5)
Eosinophils Relative: 0 %
HCT: 40 % (ref 39.0–52.0)
Hemoglobin: 12.5 g/dL — ABNORMAL LOW (ref 13.0–17.0)
Immature Granulocytes: 0 %
Lymphocytes Relative: 12 %
Lymphs Abs: 1.2 10*3/uL (ref 0.7–4.0)
MCH: 26.3 pg (ref 26.0–34.0)
MCHC: 31.3 g/dL (ref 30.0–36.0)
MCV: 84.2 fL (ref 80.0–100.0)
Monocytes Absolute: 0.5 10*3/uL (ref 0.1–1.0)
Monocytes Relative: 5 %
Neutro Abs: 8.5 10*3/uL — ABNORMAL HIGH (ref 1.7–7.7)
Neutrophils Relative %: 83 %
Platelets: 266 10*3/uL (ref 150–400)
RBC: 4.75 MIL/uL (ref 4.22–5.81)
RDW: 13.3 % (ref 11.5–15.5)
WBC: 10.4 10*3/uL (ref 4.0–10.5)
nRBC: 0 % (ref 0.0–0.2)

## 2020-04-16 LAB — LACTIC ACID, PLASMA: Lactic Acid, Venous: 1 mmol/L (ref 0.5–1.9)

## 2020-04-16 LAB — ETHANOL: Alcohol, Ethyl (B): <10 mg/dL (ref <10)

## 2020-04-16 MED ORDER — KETOROLAC TROMETHAMINE 15 MG/ML IJ SOLN
15.0000 mg | Freq: Once | INTRAMUSCULAR | Status: AC
  Administered 2020-04-16: 15 mg via INTRAVENOUS
  Filled 2020-04-16: qty 1

## 2020-04-16 MED ORDER — LACTATED RINGERS IV BOLUS
1000.0000 mL | Freq: Once | INTRAVENOUS | Status: AC
  Administered 2020-04-16: 1000 mL via INTRAVENOUS

## 2020-04-16 MED ORDER — IOHEXOL 300 MG/ML  SOLN
100.0000 mL | Freq: Once | INTRAMUSCULAR | Status: AC | PRN
  Administered 2020-04-16: 100 mL via INTRAVENOUS

## 2020-04-16 NOTE — ED Notes (Signed)
Patient transported to CT 

## 2020-04-16 NOTE — ED Notes (Signed)
Patient transported to MRI 

## 2020-04-16 NOTE — ED Notes (Signed)
Ct stated he will be next
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
## 2020-07-24 ENCOUNTER — Emergency Department (HOSPITAL_COMMUNITY)
Admission: EM | Admit: 2020-07-24 | Discharge: 2020-07-25 | Disposition: A | Discharge status: Home / Self Care | Payer: Self-pay | Attending: Emergency Medicine | Admitting: Emergency Medicine

## 2020-07-24 ENCOUNTER — Encounter (HOSPITAL_COMMUNITY): Payer: Self-pay | Admitting: Emergency Medicine

## 2020-07-24 ENCOUNTER — Other Ambulatory Visit: Payer: Self-pay

## 2020-07-24 DIAGNOSIS — K5900 Constipation, unspecified: Secondary | ICD-10-CM

## 2020-07-24 DIAGNOSIS — F17200 Nicotine dependence, unspecified, uncomplicated: Secondary | ICD-10-CM | POA: Insufficient documentation

## 2020-07-24 DIAGNOSIS — K6289 Other specified diseases of anus and rectum: Secondary | ICD-10-CM | POA: Insufficient documentation

## 2020-07-24 DIAGNOSIS — R300 Dysuria: Secondary | ICD-10-CM

## 2020-07-24 LAB — URINALYSIS, ROUTINE W REFLEX MICROSCOPIC
Bilirubin Urine: NEGATIVE
Glucose, UA: NEGATIVE mg/dL
Hgb urine dipstick: NEGATIVE
Ketones, ur: NEGATIVE mg/dL
Leukocytes,Ua: NEGATIVE
Nitrite: NEGATIVE
Protein, ur: NEGATIVE mg/dL
Specific Gravity, Urine: 1.02 (ref 1.005–1.030)
pH: 7 (ref 5.0–8.0)

## 2020-07-24 MED ORDER — AZITHROMYCIN 250 MG PO TABS
1000.0000 mg | ORAL_TABLET | Freq: Once | ORAL | Status: AC
  Administered 2020-07-25: 1000 mg via ORAL
  Filled 2020-07-24: qty 4

## 2020-07-24 MED ORDER — CEFTRIAXONE SODIUM 1 G IJ SOLR
500.0000 mg | Freq: Once | INTRAMUSCULAR | Status: AC
  Administered 2020-07-25: 500 mg via INTRAMUSCULAR
  Filled 2020-07-24: qty 10
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
## 2020-07-24 NOTE — ED Triage Notes (Signed)
Patient c/o pain with urination and constipation with rectal pain x4 days. Denies blood in urine or rectal bleeding. Reports using heroin daily. Denies N/V.

## 2020-07-24 NOTE — ED Notes (Signed)
Bladder scan results: 165 mL.

## 2020-07-24 NOTE — ED Notes (Signed)
Patient not found in room when attempted to bladder scan.

## 2020-07-24 NOTE — ED Notes (Signed)
Pt called for room, pt seen by screeners yelling on phone then leaving the lobby

## 2020-07-25 ENCOUNTER — Emergency Department (HOSPITAL_COMMUNITY): Payer: Self-pay

## 2020-07-25 MED ORDER — LIDOCAINE HCL 1 % IJ SOLN
INTRAMUSCULAR | Status: AC
  Administered 2020-07-25: 1 mL
  Filled 2020-07-25: qty 20

## 2020-07-25 NOTE — Discharge Instructions (Addendum)
Magnesium citrate: Drink the entire 10 ounce bottle mixed with equal parts Sprite or Gatorade for relief of constipation. ° °Return to the emergency department if you develop worsening pain, high fever, bloody stools, or other new and concerning symptoms. °

## 2020-07-25 NOTE — ED Notes (Signed)
Patient states he doesn't have time for vital signs his ride is waiting, met in hallway with d/c papers
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
## 2020-11-27 IMAGING — DX DG ABDOMEN 1V
1 series · 1 of 1 positions shown · non-contrast
Comparison: CT dated 08/06/2019.  CT dated April 16, 2020

CLINICAL DATA: Abdominal pain.

EXAM:
ABDOMEN - 1 VIEW

[abdomen kub]
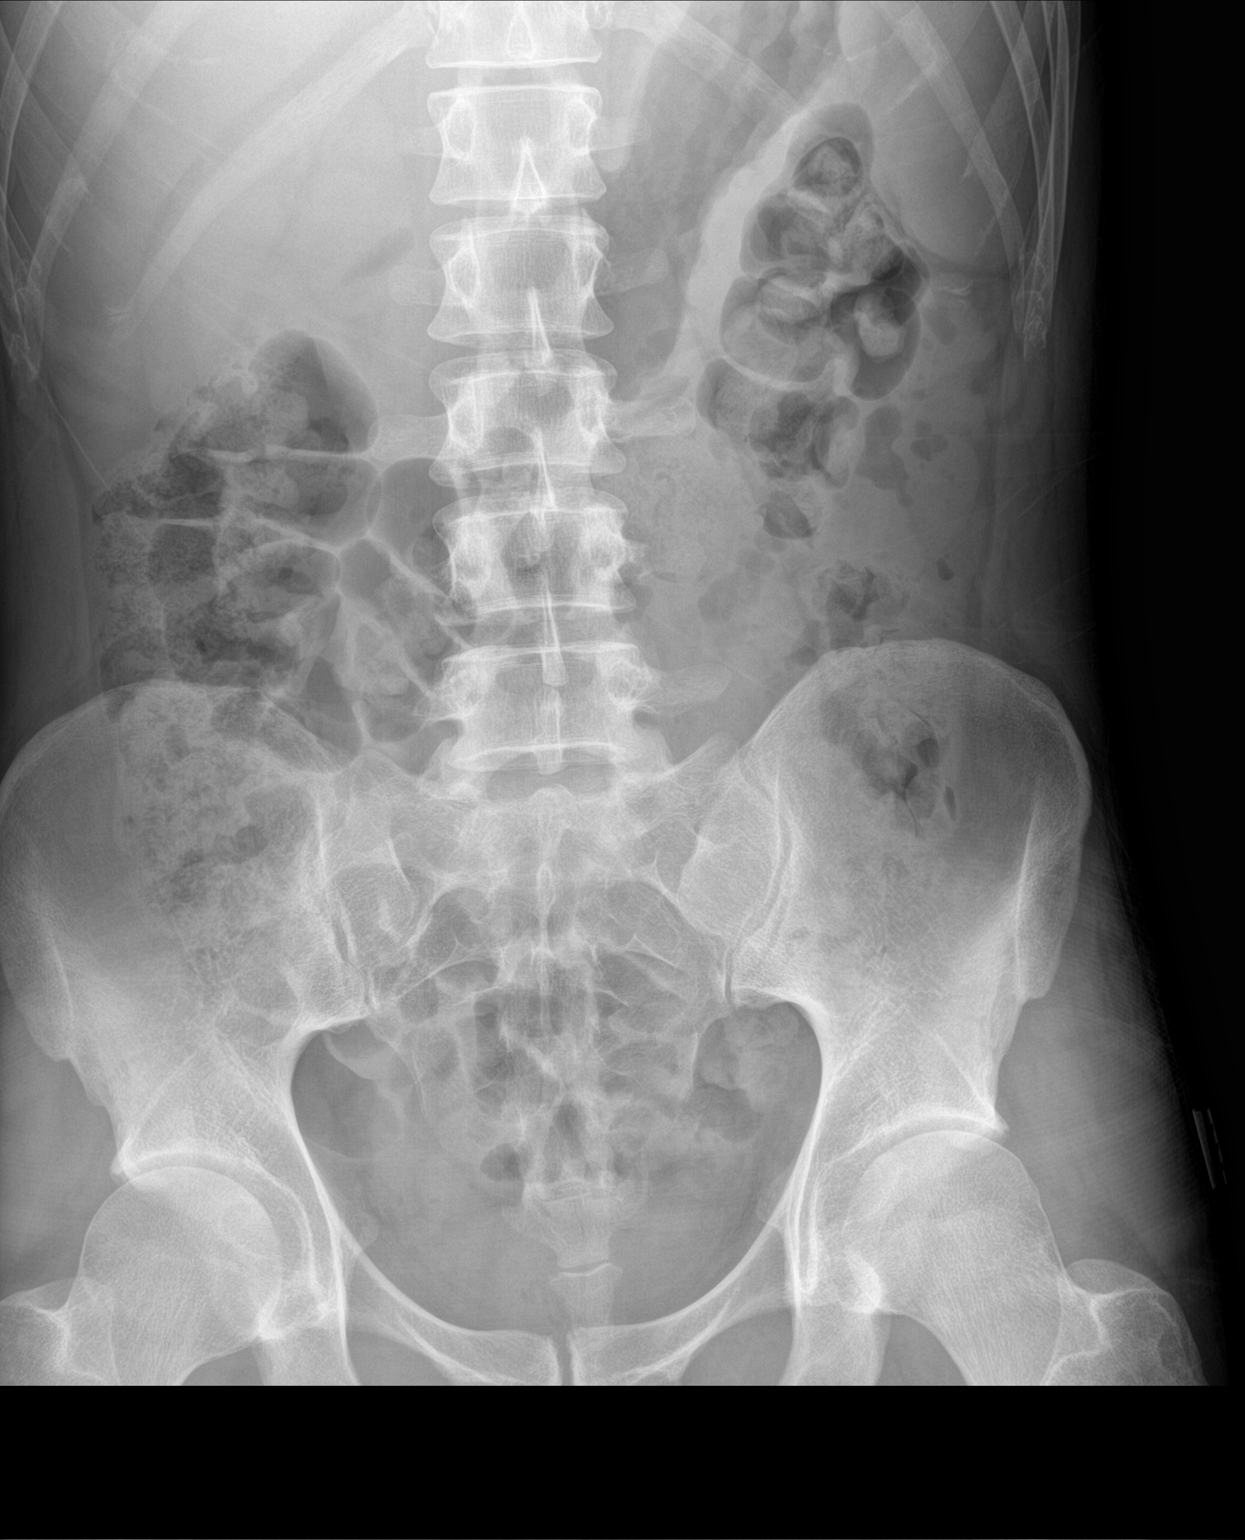

[1 of 1 positions shown; findings below may reference images not displayed]

FINDINGS: The bowel gas pattern is nonobstructive. There is a large amount of
stool throughout the colon. There are no radiopaque kidney stones.
IMPRESSION: Negative.

## 2022-06-17 ENCOUNTER — Emergency Department (HOSPITAL_COMMUNITY)
Admission: EM | Admit: 2022-06-17 | Discharge: 2022-06-17 | Disposition: A | Discharge status: Home / Self Care | Payer: Self-pay | Attending: Emergency Medicine | Admitting: Emergency Medicine

## 2022-06-17 ENCOUNTER — Encounter (HOSPITAL_COMMUNITY): Payer: Self-pay | Admitting: Emergency Medicine

## 2022-06-17 ENCOUNTER — Other Ambulatory Visit: Payer: Self-pay

## 2022-06-17 DIAGNOSIS — T401X1D Poisoning by heroin, accidental (unintentional), subsequent encounter: Secondary | ICD-10-CM | POA: Insufficient documentation

## 2022-06-17 MED ORDER — NALOXONE HCL 4 MG/0.1ML NA LIQD: NASAL | 0 refills | Status: AC

## 2022-06-17 NOTE — ED Notes (Signed)
Pt requested cardiac monitoring and IV be removed, states he feels better and if he decides to leave before treatment is complete he will let someone know.

## 2022-06-17 NOTE — ED Notes (Signed)
Reviewed d/c paperwork, pt verbalized understanding.

## 2022-06-17 NOTE — ED Triage Notes (Signed)
[place] [facility] [place] [facility] [place], pt found unresponsive and apneic, given 1.5mg  narcan IN and 0.5mg  IV narcan. Pt alert and oriented x 4. Reports he used heroin today.
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
## 2022-06-17 NOTE — Discharge Instructions (Addendum)
You were seen in the emergency room today after an accidental heroin overdose.  We have written you a prescription for Narcan.  Please fill soon as possible.  Please use the information listed above to establish care with a primary care physician and make an appointment soon as possible.

## 2023-05-01 ENCOUNTER — Telehealth: Payer: Self-pay | Admitting: Pharmacy Technician

## 2023-05-01 ENCOUNTER — Other Ambulatory Visit (HOSPITAL_COMMUNITY): Payer: Self-pay

## 2023-05-01 NOTE — Telephone Encounter (Signed)
RCID Patient Product/process development scientist completed.    The patient is insured through [employer] and has a $3712.23 copay for Biktarvy. (Looks like he has a $5000 deductible. $1200+ would be left after this)  Copay assistance is needed.

## 2023-05-03 ENCOUNTER — Ambulatory Visit (INDEPENDENT_AMBULATORY_CARE_PROVIDER_SITE_OTHER): Payer: 59 | Admitting: Internal Medicine

## 2023-05-03 ENCOUNTER — Other Ambulatory Visit: Payer: Self-pay

## 2023-05-03 ENCOUNTER — Encounter: Payer: Self-pay | Admitting: Internal Medicine

## 2023-05-03 VITALS — BP 120/84 | HR 72 | Resp 16 | Ht 72.0 in | Wt 160.0 lb

## 2023-05-03 DIAGNOSIS — B182 Chronic viral hepatitis C: Secondary | ICD-10-CM | POA: Diagnosis not present

## 2023-05-03 DIAGNOSIS — B20 Human immunodeficiency virus [HIV] disease: Secondary | ICD-10-CM | POA: Diagnosis not present

## 2023-05-03 MED ORDER — SOFOSBUVIR-VELPATASVIR 400-100 MG PO TABS: 1.0000 | ORAL_TABLET | Freq: Every day | ORAL | 2 refills | Status: DC

## 2023-05-03 MED ORDER — BIKTARVY 50-200-25 MG PO TABS: 1.0000 | ORAL_TABLET | Freq: Every day | ORAL | 11 refills | Status: DC

## 2023-05-03 NOTE — Progress Notes (Addendum)
[facility]      Reason for Consult:HIV and chronic hepatitis C    Referring Physician: ZHY    Patient ID: Roy Ashley, male    DOB: 1991/08/22, 32 y.o.   MRN: [id number]  HPI:   Roy Ashley is here to establish care for HIV and chronic hepatitis C.  He has a history of heroin use and diagnosed as above.  He was started on Biktarvy and has been on that with no concerns.  Also history of methamphetamies, marijuana.  Currently in jail.  No concerns.  Asking about long term HIV care.  Interested in hepatitis C treatment.   Past Medical History:  Diagnosis Date   Anxiety    Depression    Genital warts    Hepatitis C     Prior to Admission medications   Medication Sig Start Date End Date Taking? Authorizing Provider  ibuprofen (ADVIL,MOTRIN) 800 MG tablet Take 1 tablet (800 mg total) by mouth 3 (three) times daily. Patient not taking: Reported on 09/25/2019 07/30/16   Cheri Fowler, PA-C  Imiquimod 3.75 % CREA Apply a thin layer once daily (1 full actuation of pump) prior to bedtime; leave on skin for ~8 hours, then remove with mild soap and water. Continue treatment until there is total clearance of the warts.  Maximum duration of therapy 8 weeks. Patient not taking: Reported on 09/25/2019 10/18/18   Jacinto Halim, PA-C  naloxone [facility]) nasal spray 4 mg/0.1 mL N/a Patient not taking: Reported on 05/03/2023 06/17/22   Duard Brady, MD    No Known Allergies  Social History   Tobacco Use   Smoking status: Every Day    Current packs/day: 1.00    Types: Cigarettes   Smokeless tobacco: Never   Tobacco comments:    Not able to smoke at [facility]  Substance Use Topics   Alcohol use: No   Drug use: Yes    Types: Marijuana, IV    Comment: heroin    Family History  Problem Relation Age of Onset   Thyroid disease Mother    Epilepsy Sister     Review of Systems  Constitutional: negative for fevers and chills All other systems reviewed and are  negative    Constitutional: in no apparent distress There were no vitals filed for this visit. EYES: anicteric ENMT: no thrush Respiratory: normal respiratory effort GI: soft Musculoskeletal: no edema  Labs: Lab Results  Component Value Date   WBC 10.4 04/16/2020   HGB 12.5 (L) 04/16/2020   HCT 40.0 04/16/2020   MCV 84.2 04/16/2020   PLT 266 04/16/2020    Lab Results  Component Value Date   CREATININE 0.97 04/16/2020   BUN 9 04/16/2020   NA 139 04/16/2020   K 3.9 04/16/2020   CL 101 04/16/2020   CO2 27 04/16/2020    Lab Results  Component Value Date   ALT 62 (H) 04/16/2020   AST 60 (H) 04/16/2020   ALKPHOS 109 04/16/2020   BILITOT 0.6 04/16/2020   INR 1.0 08/06/2019     Assessment: New B20.  Diagnosed earlier this year and started on Biktarvy with no issues.  He did miss 2 weeks last month when the jail ran out but on again since then.  Good tolerance.  I discussed long term care, life expectancy, U=U and side effects.   Will continue with Biktarvy  Chronic hepatitis C, genotype 1a by report.  Discussed treatment options and will start him on  Epclusa pending labs to confirm genotype.  Plan for 12 weeeks  Plan: 1)  labs today - HIV and hepatitis C -related including hepatitis B surface Ag. 2) continue Biktarvy 3) Epclusa x 12 weeks Vaccines being done at jail Will have THP help facilitate care in jail.

## 2023-05-08 ENCOUNTER — Other Ambulatory Visit: Payer: Self-pay

## 2023-05-08 ENCOUNTER — Telehealth: Payer: Self-pay

## 2023-05-08 MED ORDER — BIKTARVY 50-200-25 MG PO TABS: 1.0000 | ORAL_TABLET | Freq: Every day | ORAL | 11 refills | Status: AC

## 2023-05-08 NOTE — Telephone Encounter (Signed)
We received a fax from [facility] stating patient will need to use [facility] for his [place] bdue to insurance. I reached out to the patient's mother due to patient's phone being disconnected to notify him of the pharmacy change.  Per patient's mother patient is incarcerated and she is unsure for how long. I spoke to Dionne with [facility] to inform her of the patient being on [place].  Per Dionne they are aware and she will reach out to [facility].  Melodye Swor Jonathon Resides, CMA

## 2023-05-09 ENCOUNTER — Telehealth: Payer: Self-pay

## 2023-05-09 NOTE — Telephone Encounter (Signed)
Received fax from [facility] requesting call to confirm address. Patient currently incarcerated at [facility] jail.  Left voicemail with [facility] to [facility]ill.  Juanita Laster, RMA

## 2023-05-09 NOTE — Telephone Encounter (Signed)
We should probably wait to treat his Hep C until after he is out of jail. It always seems to not work out well when we try to do that!

## 2023-05-12 LAB — LIVER FIBROSIS, FIBROTEST-ACTITEST
ALT: 66 U/L — ABNORMAL HIGH (ref 9–46)
Alpha-2-Macroglobulin: 177 mg/dL (ref 106–279)
Apolipoprotein A1: 169 mg/dL (ref 94–176)
Bilirubin: 0.3 mg/dL (ref 0.2–1.2)
Fibrosis Score: 0.1
GGT: 60 U/L (ref 3–90)
Haptoglobin: 106 mg/dL (ref 43–212)
Necroinflammat ACT Score: 0.32
Reference ID: 5016985

## 2023-05-12 LAB — HEPATITIS B CORE ANTIBODY, TOTAL: Hep B Core Total Ab: NONREACTIVE

## 2023-05-12 LAB — HIV ANTIBODY (ROUTINE TESTING W REFLEX): HIV 1&2 Ab, 4th Generation: REACTIVE — AB

## 2023-05-12 LAB — COMPLETE METABOLIC PANEL WITH GFR
AG Ratio: 1.5 (calc) (ref 1.0–2.5)
ALT: 62 U/L — ABNORMAL HIGH (ref 9–46)
AST: 53 U/L — ABNORMAL HIGH (ref 10–40)
Albumin: 4.3 g/dL (ref 3.6–5.1)
Alkaline phosphatase (APISO): 56 U/L (ref 36–130)
BUN: 11 mg/dL (ref 7–25)
CO2: 25 mmol/L (ref 20–32)
Calcium: 9.4 mg/dL (ref 8.6–10.3)
Chloride: 106 mmol/L (ref 98–110)
Creat: 0.99 mg/dL (ref 0.60–1.26)
Globulin: 2.8 g/dL (calc) (ref 1.9–3.7)
Glucose, Bld: 105 mg/dL — ABNORMAL HIGH (ref 65–99)
Potassium: 4.3 mmol/L (ref 3.5–5.3)
Sodium: 140 mmol/L (ref 135–146)
Total Bilirubin: 0.3 mg/dL (ref 0.2–1.2)
Total Protein: 7.1 g/dL (ref 6.1–8.1)
eGFR: 103 mL/min/{1.73_m2} (ref >=60)

## 2023-05-12 LAB — LIPID PANEL
Cholesterol: 180 mg/dL (ref <200)
HDL: 50 mg/dL (ref >=40)
LDL Cholesterol (Calc): 105 mg/dL (calc) — ABNORMAL HIGH
Non-HDL Cholesterol (Calc): 130 mg/dL (calc) — ABNORMAL HIGH (ref <130)
Total CHOL/HDL Ratio: 3.6 (calc) (ref <5.0)
Triglycerides: 141 mg/dL (ref <150)

## 2023-05-12 LAB — CBC WITH DIFFERENTIAL/PLATELET
Absolute Monocytes: 314 cells/uL (ref 200–950)
Basophils Absolute: 10 cells/uL (ref 0–200)
Basophils Relative: 0.2 %
Eosinophils Absolute: 98 cells/uL (ref 15–500)
Eosinophils Relative: 2 %
HCT: 34.8 % — ABNORMAL LOW (ref 38.5–50.0)
Hemoglobin: 11.7 g/dL — ABNORMAL LOW (ref 13.2–17.1)
Lymphs Abs: 1818 cells/uL (ref 850–3900)
MCH: 29.2 pg (ref 27.0–33.0)
MCHC: 33.6 g/dL (ref 32.0–36.0)
MCV: 86.8 fL (ref 80.0–100.0)
MPV: 10.3 fL (ref 7.5–12.5)
Monocytes Relative: 6.4 %
Neutro Abs: 2661 cells/uL (ref 1500–7800)
Neutrophils Relative %: 54.3 %
Platelets: 239 10*3/uL (ref 140–400)
RBC: 4.01 10*6/uL — ABNORMAL LOW (ref 4.20–5.80)
RDW: 13.1 % (ref 11.0–15.0)
Total Lymphocyte: 37.1 %
WBC: 4.9 10*3/uL (ref 3.8–10.8)

## 2023-05-12 LAB — HEPATITIS A ANTIBODY, TOTAL: Hepatitis A AB,Total: REACTIVE — AB

## 2023-05-12 LAB — HEPATITIS B SURFACE ANTIGEN: Hepatitis B Surface Ag: NONREACTIVE

## 2023-05-12 LAB — HEPATITIS B SURFACE ANTIBODY,QUALITATIVE: Hep B S Ab: REACTIVE — AB

## 2023-05-12 LAB — QUANTIFERON-TB GOLD PLUS
Mitogen-NIL: 3.76 IU/mL
NIL: 0.02 IU/mL
QuantiFERON-TB Gold Plus: NEGATIVE
TB1-NIL: 0.14 IU/mL
TB2-NIL: 0.23 IU/mL

## 2023-05-12 LAB — HIV-1 RNA QUANT-NO REFLEX-BLD
HIV 1 RNA Quant: 29 Copies/mL — ABNORMAL HIGH
HIV-1 RNA Quant, Log: 1.46 Log cps/mL — ABNORMAL HIGH

## 2023-05-12 LAB — HIV-1/2 AB - DIFFERENTIATION
HIV-1 antibody: POSITIVE — AB
HIV-2 Ab: NEGATIVE

## 2023-05-12 LAB — RPR: RPR Ser Ql: NONREACTIVE

## 2023-05-12 LAB — HEPATITIS C RNA QUANTITATIVE
HCV Quantitative Log: 6.22 log IU/mL — ABNORMAL HIGH
HCV RNA, PCR, QN: 1670000 IU/mL — ABNORMAL HIGH

## 2024-10-01 ENCOUNTER — Other Ambulatory Visit (HOSPITAL_COMMUNITY): Payer: Self-pay

## 2024-10-01 ENCOUNTER — Telehealth: Payer: Self-pay

## 2024-10-01 NOTE — Telephone Encounter (Signed)
 Pharmacy Patient Advocate Encounter  Insurance verification completed.   The patient is insured through [employer]   Ran test claim for Biktarvy . Currently a quantity of 30 is a 30 day supply and the co-pay is $0.00 . [place] $0.00  This test claim was processed through [facility]- copay amounts may vary at other pharmacies due to pharmacy/plan contracts, or as the patient moves through the different stages of their insurance plan.

## 2024-10-27 ENCOUNTER — Ambulatory Visit: Admitting: Internal Medicine

## 2024-10-27 ENCOUNTER — Ambulatory Visit: Payer: Self-pay | Admitting: Pharmacist

## 2024-10-27 ENCOUNTER — Ambulatory Visit: Payer: Self-pay

## 2024-10-27 ENCOUNTER — Encounter: Payer: MEDICAID | Admitting: Internal Medicine

## 2024-10-27 ENCOUNTER — Ambulatory Visit: Payer: Self-pay | Admitting: Infectious Disease

## 2024-11-17 ENCOUNTER — Ambulatory Visit: Payer: Self-pay | Admitting: Pharmacist

## 2024-11-17 ENCOUNTER — Ambulatory Visit: Payer: Self-pay | Admitting: Family

## 2024-11-17 ENCOUNTER — Ambulatory Visit: Payer: Self-pay
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
## 2024-11-19 ENCOUNTER — Other Ambulatory Visit (HOSPITAL_COMMUNITY): Payer: Self-pay

## 2024-11-19 ENCOUNTER — Encounter: Payer: Self-pay | Admitting: Internal Medicine

## 2024-11-19 ENCOUNTER — Ambulatory Visit: Payer: Self-pay | Admitting: Internal Medicine

## 2024-11-19 ENCOUNTER — Other Ambulatory Visit (HOSPITAL_COMMUNITY)
Admission: RE | Admit: 2024-11-19 | Discharge: 2024-11-19 | Disposition: A | Discharge status: Home / Self Care | Source: Ambulatory Visit | Attending: Internal Medicine | Admitting: Internal Medicine

## 2024-11-19 ENCOUNTER — Ambulatory Visit

## 2024-11-19 ENCOUNTER — Ambulatory Visit (INDEPENDENT_AMBULATORY_CARE_PROVIDER_SITE_OTHER): Payer: Self-pay | Admitting: Pharmacist

## 2024-11-19 ENCOUNTER — Other Ambulatory Visit: Payer: Self-pay

## 2024-11-19 VITALS — BP 137/72 | HR 102 | Temp 98.2°F | Ht 72.0 in | Wt 161.0 lb

## 2024-11-19 DIAGNOSIS — B20 Human immunodeficiency virus [HIV] disease: Secondary | ICD-10-CM

## 2024-11-19 DIAGNOSIS — A630 Anogenital (venereal) warts: Secondary | ICD-10-CM | POA: Diagnosis not present

## 2024-11-19 DIAGNOSIS — F1120 Opioid dependence, uncomplicated: Secondary | ICD-10-CM

## 2024-11-19 DIAGNOSIS — B182 Chronic viral hepatitis C: Secondary | ICD-10-CM

## 2024-11-19 MED ORDER — BIKTARVY 50-200-25 MG PO TABS: 1.0000 | ORAL_TABLET | Freq: Every day | ORAL | Status: AC

## 2024-11-19 MED ORDER — IMIQUIMOD 3.75 % EX CREA: TOPICAL_CREAM | CUTANEOUS | 1 refills | Status: DC

## 2024-11-19 NOTE — Progress Notes (Signed)
 Medication Samples have been provided to the patient.  Drug name: Biktarvy         Strength: 50/200/25 mg       Qty: 2 bottles (14 tablets)  LOT: [id number]   Exp.Date: 11/22/26  Samples requested by Dr. Luiz.  Dosing instructions: Take one tablet by mouth once daily  The patient has been instructed regarding the correct time, dose, and frequency of taking this medication, including desired effects and most common side effects.   [place] Kinyon L. Kourtnee Lahey, PharmD, BCIDP, AAHIVP, CPP Clinical Pharmacist Practitioner Infectious Diseases Clinical Pharmacist [facility]

## 2024-11-20 LAB — CYTOLOGY, (ORAL, ANAL, URETHRAL) ANCILLARY ONLY
Chlamydia: NEGATIVE
Comment: NEGATIVE
Comment: NORMAL
Neisseria Gonorrhea: NEGATIVE

## 2024-11-20 LAB — T-HELPER CELL (CD4) - (RCID CLINIC ONLY)
CD4 % Helper T Cell: 34 % (ref 33–65)
CD4 T Cell Abs: 636 /uL (ref 400–1790)

## 2024-11-20 LAB — URINE CYTOLOGY ANCILLARY ONLY
Chlamydia: NEGATIVE
Comment: NEGATIVE
Comment: NORMAL
Neisseria Gonorrhea: NEGATIVE

## 2024-11-21 LAB — COMPLETE METABOLIC PANEL WITHOUT GFR
AG Ratio: 1.8 (calc) (ref 1.0–2.5)
ALT: 21 U/L (ref 9–46)
AST: 37 U/L (ref 10–40)
Albumin: 4.6 g/dL (ref 3.6–5.1)
Alkaline phosphatase (APISO): 67 U/L (ref 36–130)
BUN: 10 mg/dL (ref 7–25)
CO2: 27 mmol/L (ref 20–32)
Calcium: 9.1 mg/dL (ref 8.6–10.3)
Chloride: 106 mmol/L (ref 98–110)
Creat: 0.99 mg/dL (ref 0.60–1.26)
Globulin: 2.5 g/dL (ref 1.9–3.7)
Glucose, Bld: 86 mg/dL (ref 65–99)
Potassium: 3.8 mmol/L (ref 3.5–5.3)
Sodium: 142 mmol/L (ref 135–146)
Total Bilirubin: 0.5 mg/dL (ref 0.2–1.2)
Total Protein: 7.1 g/dL (ref 6.1–8.1)

## 2024-11-21 LAB — HIV-1 RNA QUANT-NO REFLEX-BLD
HIV 1 RNA Quant: NOT DETECTED {copies}/mL
HIV-1 RNA Quant, Log: NOT DETECTED {Log_copies}/mL

## 2024-11-21 LAB — CBC WITH DIFFERENTIAL/PLATELET
Absolute Lymphocytes: 1989 {cells}/uL (ref 850–3900)
Absolute Monocytes: 270 {cells}/uL (ref 200–950)
Basophils Absolute: 10 {cells}/uL (ref 0–200)
Basophils Relative: 0.2 %
Eosinophils Absolute: 142 {cells}/uL (ref 15–500)
Eosinophils Relative: 2.9 %
HCT: 36.8 % — ABNORMAL LOW (ref 39.4–51.1)
Hemoglobin: 12.4 g/dL — ABNORMAL LOW (ref 13.2–17.1)
MCH: 29.5 pg (ref 27.0–33.0)
MCHC: 33.7 g/dL (ref 31.6–35.4)
MCV: 87.4 fL (ref 81.4–101.7)
MPV: 10.7 fL (ref 7.5–12.5)
Monocytes Relative: 5.5 %
Neutro Abs: 2489 {cells}/uL (ref 1500–7800)
Neutrophils Relative %: 50.8 %
Platelets: 213 10*3/uL (ref 140–400)
RBC: 4.21 Million/uL (ref 4.20–5.80)
RDW: 11.8 % (ref 11.0–15.0)
Total Lymphocyte: 40.6 %
WBC: 4.9 10*3/uL (ref 3.8–10.8)

## 2024-11-21 LAB — SYPHILIS: RPR W/REFLEX TO RPR TITER AND TREPONEMAL ANTIBODIES, TRADITIONAL SCREENING AND DIAGNOSIS ALGORITHM: RPR Ser Ql: NONREACTIVE

## 2024-11-21 LAB — LIPID PANEL
Cholesterol: 152 mg/dL
HDL: 58 mg/dL
LDL Cholesterol (Calc): 80 mg/dL
Non-HDL Cholesterol (Calc): 94 mg/dL
Total CHOL/HDL Ratio: 2.6 (calc)
Triglycerides: 68 mg/dL

## 2024-11-21 LAB — HEPATITIS C RNA QUANTITATIVE
HCV Quantitative Log: <1.18 {Log_IU}/mL
HCV RNA, PCR, QN: <15 [IU]/mL

## 2024-11-22 MED ORDER — IMIQUIMOD 3.75 % EX CREA: TOPICAL_CREAM | CUTANEOUS | 1 refills | Status: AC

## 2024-11-26 ENCOUNTER — Encounter: Payer: Self-pay | Admitting: Pharmacist

## 2024-11-26 ENCOUNTER — Other Ambulatory Visit (HOSPITAL_COMMUNITY): Payer: Self-pay

## 2025-01-01 ENCOUNTER — Ambulatory Visit: Payer: Self-pay | Admitting: Internal Medicine
